# Patient Record
Sex: Female | Born: 1985 | Race: Black or African American | Hispanic: No | Marital: Single | Smoking: Never smoker
Health system: Southern US, Community
[De-identification: ages and names within clinical notes are randomized; demographics above are authoritative.]

## PROBLEM LIST (undated history)

## (undated) HISTORY — PX: NO PAST SURGERIES: SHX2092

---

## 2014-06-21 ENCOUNTER — Emergency Department (HOSPITAL_COMMUNITY)
Admission: EM | Admit: 2014-06-21 | Discharge: 2014-06-21 | Disposition: A | Discharge status: Home / Self Care | Payer: BC Managed Care – PPO | Attending: Emergency Medicine | Admitting: Emergency Medicine

## 2014-06-21 ENCOUNTER — Emergency Department (HOSPITAL_COMMUNITY): Payer: BC Managed Care – PPO

## 2014-06-21 ENCOUNTER — Encounter (HOSPITAL_COMMUNITY): Payer: Self-pay | Admitting: Emergency Medicine

## 2014-06-21 DIAGNOSIS — Z79899 Other long term (current) drug therapy: Secondary | ICD-10-CM | POA: Insufficient documentation

## 2014-06-21 DIAGNOSIS — R51 Headache: Secondary | ICD-10-CM

## 2014-06-21 DIAGNOSIS — F17200 Nicotine dependence, unspecified, uncomplicated: Secondary | ICD-10-CM | POA: Insufficient documentation

## 2014-06-21 DIAGNOSIS — H539 Unspecified visual disturbance: Secondary | ICD-10-CM

## 2014-06-21 DIAGNOSIS — R519 Headache, unspecified: Secondary | ICD-10-CM

## 2014-06-21 DIAGNOSIS — G43909 Migraine, unspecified, not intractable, without status migrainosus: Secondary | ICD-10-CM | POA: Insufficient documentation

## 2014-06-21 MED ORDER — FLUORESCEIN SODIUM 1 MG OP STRP
1.0000 | ORAL_STRIP | Freq: Once | OPHTHALMIC | Status: DC
  Filled 2014-06-21: qty 1

## 2014-06-21 MED ORDER — IBUPROFEN 800 MG PO TABS
800.0000 mg | ORAL_TABLET | Freq: Once | ORAL | Status: AC
  Administered 2014-06-21: 800 mg via ORAL
  Filled 2014-06-21: qty 1

## 2014-06-21 MED ORDER — TETRACAINE HCL 0.5 % OP SOLN
1.0000 [drp] | Freq: Once | OPHTHALMIC | Status: DC
  Filled 2014-06-21: qty 2

## 2014-06-21 NOTE — ED Notes (Signed)
Initial Contact - pt to [facility], reports c/o "blurry spot" of central visual field of R eye x5 days.  Pt reports seen by opthalmologist yesterday with completely negative exam, pt reports has specialist follow up appt tomorrow "but it's bothering me too much".  Pt reports slight amt R eye pressure, but denies all other complaints.  Neuros grossly intact.  A+Ox4.  NAD.

## 2014-06-21 NOTE — Discharge Instructions (Signed)
Visual Disturbances °You have had a disturbance in your vision. This may be caused by various conditions, such as: °· Migraines. Migraine headaches are often preceded by a disturbance in vision. Blind spots or light flashes are followed by a headache. This type of visual disturbance is temporary. It does not damage the eye. °· Glaucoma. This is caused by increased pressure in the eye. Symptoms include haziness, blurred vision, or seeing rainbow colored circles when looking at bright lights. Partial or complete visual loss can occur. You may or may not experience eye pain. Visual loss may be gradual or sudden and is irreversible. Glaucoma is the leading cause of blindness. °· Retina problems. Vision will be reduced if the retina becomes detached or if there is a circulation problem as with diabetes, high blood pressure, or a mini-stroke. Symptoms include seeing "floaters," flashes of light, or shadows, as if a curtain has fallen over your eye. °· Optic nerve problems. The main nerve in your eye can be damaged by redness, soreness, and swelling (inflammation), poor circulation, drugs, and toxins. °It is very important to have a complete exam done by a specialist to determine the exact cause of your eye problem. The specialist may recommend medicines or surgery, depending on the cause of the problem. This can help prevent further loss of vision or reduce the risk of having a stroke. Contact the caregiver to whom you have been referred and arrange for follow-up care right away. °SEEK IMMEDIATE MEDICAL CARE IF:  °· Your vision gets worse. °· You develop severe headaches. °· You have any weakness or numbness in the face, arms, or legs. °· You have any trouble speaking or walking. °Document Released: 01/23/2005 Document Revised: 03/09/2012 Document Reviewed: 05/16/2010 °ExitCare® Patient Information ©2015 [employer]. This information is not intended to replace advice given to you by your health care provider. Make sure  you discuss any questions you have with your health care provider. ° °

## 2014-06-21 NOTE — ED Provider Notes (Signed)
CSN: [id number]     Arrival date & time 06/21/14  1256 History   None    Chief Complaint  Patient presents with  . Blurred Vision  . Headache   HPI Comments: Patient presents to the ED with worsening vision in her right eye.  Patient feels that she has lost her central vision since Friday.  She states it is like having a water spot over her eye. She has tried clear eyes for it with little relief.  She states that this has never happened before.  She denies any eye pain, injury to the eye or head, foreign body sensation, floaters, halos around lights, or nausea, and vomiting.  She does have a history of genital herpes but takes daily valtrex.    Patient is a 28 y.o. female presenting with headaches. The history is provided by the patient. No language interpreter was used.  Headache Pain location:  Frontal (Right frontal with headache) Radiates to:  Does not radiate Severity currently:  2/10 Onset quality:  Gradual Duration:  4 days Timing:  Intermittent Progression:  Waxing and waning Similar to prior headaches: yes   Relieved by:  None tried Worsened by:  Nothing tried Ineffective treatments:  None tried Associated symptoms: blurred vision and visual change   Associated symptoms: no abdominal pain, no congestion, no cough, no dizziness, no ear pain, no pain, no facial pain, no fatigue, no nausea, no neck pain, no neck stiffness, no numbness, no photophobia, no sinus pressure, no sore throat, no swollen glands, no syncope, no tingling, no URI, no vomiting and no weakness   Risk factors: no anger, does not have insomnia and lifestyle not sedentary     History reviewed. No pertinent past medical history. History reviewed. No pertinent past surgical history. History reviewed. No pertinent family history. History  Substance Use Topics  . Smoking status: Current Every Day Smoker  . Smokeless tobacco: Not on file  . Alcohol Use: Yes     Comment: social   OB History   Grav Para Term  Preterm Abortions TAB SAB Ect Mult Living                 Review of Systems  Constitutional: Negative for fatigue.  HENT: Negative for congestion, ear pain, sinus pressure and sore throat.   Eyes: Positive for blurred vision and visual disturbance. Negative for photophobia, pain, discharge, redness and itching.  Respiratory: Negative for cough.   Cardiovascular: Negative for syncope.  Gastrointestinal: Negative for nausea, vomiting and abdominal pain.  Musculoskeletal: Negative for neck pain and neck stiffness.  Neurological: Positive for headaches. Negative for dizziness, facial asymmetry, light-headedness and numbness.  All other systems reviewed and are negative.     Allergies  Review of patient's allergies indicates no known allergies.  Home Medications   Prior to Admission medications   Medication Sig Start Date End Date Taking? Authorizing Provider  Multiple Vitamin (MULTIVITAMIN WITH MINERALS) TABS tablet Take 1 tablet by mouth every morning.   Yes Historical Provider, MD  norethindrone-ethinyl estradiol (JUNEL FE,GILDESS FE,LOESTRIN FE) 1-20 MG-MCG tablet Take 1 tablet by mouth every morning.   Yes Historical Provider, MD  Probiotic Product (PROBIOTIC DAILY PO) Take 1 capsule by mouth every morning.   Yes Historical Provider, MD  valACYclovir (VALTREX) 500 MG tablet Take 500 mg by mouth 2 (two) times daily.   Yes Historical Provider, MD   BP 133/80  Pulse 94  Temp(Src) 98.5 F (36.9 C) (Oral)  Resp 16  SpO2  98%  LMP 06/07/2014 Physical Exam  Nursing note and vitals reviewed. Constitutional: She is oriented to person, place, and time. She appears well-developed and well-nourished. No distress.  HENT:  Head: Normocephalic and atraumatic.  Mouth/Throat: No oropharyngeal exudate.  Eyes: Conjunctivae and EOM are normal. Pupils are equal, round, and reactive to light. Right eye exhibits no discharge. Left eye exhibits no discharge. No scleral icterus.  Fundoscopic  exam:      The right eye shows no AV nicking, no exudate, no hemorrhage and no papilledema. The right eye shows red reflex.       The left eye shows no AV nicking, no exudate, no hemorrhage and no papilledema. The left eye shows red reflex.  Right tonopen pressure of 24  Neck: Normal range of motion. Neck supple. No JVD present. No thyromegaly present.  Cardiovascular: Normal rate, regular rhythm, normal heart sounds and intact distal pulses.  Exam reveals no gallop and no friction rub.   No murmur heard. Pulmonary/Chest: Effort normal and breath sounds normal. No respiratory distress. She has no wheezes. She has no rales. She exhibits no tenderness.  Musculoskeletal: Normal range of motion.  Lymphadenopathy:    She has no cervical adenopathy.  Neurological: She is alert and oriented to person, place, and time. She has normal strength. No cranial nerve deficit or sensory deficit. Coordination normal.  Skin: Skin is warm and dry. She is not diaphoretic.  Psychiatric: She has a normal mood and affect. Her behavior is normal. Judgment and thought content normal.    ED Course  Procedures (including critical care time) Labs Review Labs Reviewed - No data to display  Imaging Review Ct Head Wo Contrast  06/21/2014   CLINICAL DATA:  Visual changes/blurred vision RIGHT eye for 4 days, headache  EXAM: CT HEAD WITHOUT CONTRAST  TECHNIQUE: Contiguous axial images were obtained from the base of the skull through the vertex without intravenous contrast.  COMPARISON:  None  FINDINGS: Normal ventricular morphology.  No midline shift or mass effect.  Normal appearance of brain parenchyma.  No intracranial hemorrhage, mass lesion, or acute infarction.  Visualized paranasal sinuses and mastoid air cells clear.  Bones unremarkable.  IMPRESSION: Normal exam.   Electronically Signed   By: Ulyses Southward M.D.   On: 06/21/2014 17:23     EKG Interpretation None      MDM   Final diagnoses:  Acute nonintractable  headache, unspecified headache type  Visual disturbance   Patient presents to the [facility] ED with complaint of headache and vision changes.  Patient was treated here with ibuprofen and she had relief of her headache.  She has been seen at [facility] on [address] by an optometrist and she was referred to a second opinion and is to be seen by what sounds like a retinal specialist tomorrow at 9:20 am.  I have rechecked tonometry here with a reading of 24 of the right eye.   Patient was given a CT scan of the head without contrast which showed no acute abnormalities at this time.  Patient is stable to be discharged at this time.  I have urged her to follow-up with her scheduled appointment tomorrow.  She does not have any meningeal signs and there are no focal neuro deficits at this time.  Patient states understanding with the above plan.  She was given strict return precautions of abrupt onset of the worst headache of her life, fever, and neck stiffness.  She states agreement.  I have  conferred with Dr. Denton LankSteinl on this case and he agrees with the above plan.      Clydie Braunourtney A Forucci, PA-C 06/21/14 1801

## 2014-06-21 NOTE — ED Notes (Signed)
Per pt, rt eye has blank spot since Friday.  [facility] and unable to diagnose any changes.  Pt was told she had some far sightedness.  Pt states she has hx of headache.  Has slight headache now.

## 2014-06-23 NOTE — ED Provider Notes (Signed)
Medical screening examination/treatment/procedure(s) were conducted as a shared visit with non-physician practitioner(s) and myself.  I personally evaluated the patient during the encounter.  Pt notes small area centrally of blurry vision in right eye for the past few days. No visual field cut or amaurosis. No acute or abrupt change or worsening today, symptoms stable since onset. States went to eye doctor but no specific dx, and has appt w retina specialist tomorrow. No eye pain. Had gradual onset dull headache earlier. No neck pain or stiffness. No numbness/weakness. Perrl. Eomi. Fundus without acute hem. Sharp discs.    Alyssa RootsKevin E Steinl, MD 06/23/14 [phone]

## 2014-07-11 ENCOUNTER — Ambulatory Visit: Payer: BC Managed Care – PPO | Admitting: Diagnostic Neuroimaging

## 2014-09-06 ENCOUNTER — Encounter: Payer: Self-pay | Admitting: Diagnostic Neuroimaging

## 2014-09-06 ENCOUNTER — Encounter (INDEPENDENT_AMBULATORY_CARE_PROVIDER_SITE_OTHER): Payer: Self-pay

## 2014-09-06 ENCOUNTER — Ambulatory Visit (INDEPENDENT_AMBULATORY_CARE_PROVIDER_SITE_OTHER): Payer: BC Managed Care – PPO | Admitting: Diagnostic Neuroimaging

## 2014-09-06 ENCOUNTER — Ambulatory Visit (INDEPENDENT_AMBULATORY_CARE_PROVIDER_SITE_OTHER): Payer: BC Managed Care – PPO

## 2014-09-06 VITALS — BP 125/73 | HR 79 | Temp 97.7°F | Ht 61.75 in | Wt 175.2 lb

## 2014-09-06 DIAGNOSIS — H538 Other visual disturbances: Secondary | ICD-10-CM

## 2014-09-06 NOTE — Procedures (Signed)
   [facility]  VEP (VISUAL EVOKED POTENTIAL) REPORT   STUDY DATE: 09/06/14 PATIENT NAME: Alyssa Gomez DOB: 1986/05/26 MRN: [id number]  ORDERING CLINICIAN: Joycelyn Schmid, MD   TECHNOLOGIST: Gearldine Shown  TECHNIQUE: The visual evoked potential test was performed using 32 x 32 check sizes with full pattern reversal. CLINICAL INFORMATION: 28 year old female with right eye vision changes.  FINDINGS: The visual acuity was 20/100 OD and 20/40 OS.  There are well formed evoked potential wave forms bilaterally.   P100 latency with right eye stimulation: 149 ms.   P100 latency with left eye stimulation: 87 ms.  The amplitudes for the P100 waveforms were also within normal limits bilaterally.   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 This can be seen in autoimmune, demyelinating or compressive etiologies.   INTERPRETING PHYSICIAN:  Suanne Marker, MD Certified in Neurology, Neurophysiology and Neuroimaging  [facility] [address], [address] [place], [place] [address] [phone]

## 2014-09-06 NOTE — Progress Notes (Signed)
[facility]  PATIENT: Alyssa Gomez DOB: [birth year]  REFERRING CLINICIAN: Allyne Gee  HISTORY FROM: patient and friend  REASON FOR VISIT: new consult    HISTORICAL  CHIEF COMPLAINT:  Chief Complaint  Patient presents with  . Loss of Vision    HISTORY OF PRESENT ILLNESS:   28 year old female with here for evaluation of blurred vision in the right eye.  June 2015 patient developed sudden onset blurred vision in the right eye. Patient went to a doctor then referred to retina specialist. No specific ocular abnormality was found. Patient had MRI of the brain and orbits which was notable for a few nonspecific foci of gliosis but otherwise normal orbits. Patient then referred to me for further evaluation.  She also has history of headaches is 28 years old, frontal, temporal, pressure throbbing headaches without nausea or vomiting. She has some photophobia. Patient was having 2 headaches per week but now reduced to one per month. Patient did have some additional headache during this episode of right eye blurred vision in June.  Patient continues to have some revision of the right eye. She feels that her symptoms are slightly improved.  No numbness, weakness, balance difficulty, incontinence, slurred speech or trouble talking.   REVIEW OF SYSTEMS: Full 14 system review of systems performed and notable only for blurred vision feeling hot headache snoring.    ALLERGIES: No Known Allergies  HOME MEDICATIONS: Outpatient Prescriptions Prior to Visit  Medication Sig Dispense Refill  . norethindrone-ethinyl estradiol (JUNEL FE,GILDESS FE,LOESTRIN FE) 1-20 MG-MCG tablet Take 1 tablet by mouth every morning.      . valACYclovir (VALTREX) 500 MG tablet Take 500 mg by mouth daily.       . Lactobacillus (ACIDOPHOLUS PO) Take 1 tablet by mouth daily.      . Multiple Vitamin (MULTIVITAMIN WITH MINERALS) TABS tablet Take 1 tablet by mouth every morning.       No  facility-administered medications prior to visit.    PAST MEDICAL HISTORY: History reviewed. No pertinent past medical history.  PAST SURGICAL HISTORY: Past Surgical History  Procedure Laterality Date  . No past surgeries      FAMILY HISTORY: Family History  Problem Relation Age of Onset  . Diabetes Mother   . Hypertension Father   . Pancreatic cancer Paternal Grandmother   . Blindness Cousin     SOCIAL HISTORY:  History   Social History  . Marital Status: Single    Spouse Name: N/A    Number of Children: 0  . Years of Education: BA   Occupational History  .  Other    [employer]   Social History Main Topics  . Smoking status: Never Smoker   . Smokeless tobacco: Never Used  . Alcohol Use: Not on file     Comment: social, one drink per week  . Drug Use: Yes     Comment: marijuna daily  . Sexual Activity: Not on file   Other Topics Concern  . Not on file   Social History Narrative   Patient lives at home alone.   Caffeine Use: Rarely     PHYSICAL EXAM  Filed Vitals:   09/06/14 1042  BP: 125/73  Pulse: 79  Temp: 97.7 F (36.5 C)  TempSrc: Oral  Height: 5' 1.75" (1.568 m)  Weight: 175 lb 3.2 oz (79.47 kg)    Not recorded    Body mass index is 32.32 kg/(m^2).  GENERAL EXAM: Patient is in no distress; well  developed, nourished and groomed; neck is supple; MOON FACIES, ENLARGED RIGHT THYROID, PROPTOSIS  CARDIOVASCULAR: Regular rate and rhythm, no murmurs, no carotid bruits  NEUROLOGIC: MENTAL STATUS: awake, alert, oriented to person, place and time, recent and remote memory intact, normal attention and concentration, language fluent, comprehension intact, naming intact, fund of knowledge appropriate CRANIAL NERVE: no papilledema on fundoscopic exam, pupils equal and reactive to light; RELATIVE RIGHT AFFERENT PUPILLARY DEFECT WITH DECR SENS TO LIGHT IN RIGHT EYE; RIGHT EYE 20/100, LEFT EYE 20/40, , visual fields full to confrontation, extraocular  muscles intact, no nystagmus, facial sensation and strength symmetric, hearing intact, palate elevates symmetrically, uvula midline, shoulder shrug symmetric, tongue midline. MOTOR: normal bulk and tone, full strength in the BUE, BLE SENSORY: normal and symmetric to light touch, pinprick, temperature, vibration  COORDINATION: finger-nose-finger, fine finger movements, heel-shin normal REFLEXES: deep tendon reflexes present and symmetric GAIT/STATION: narrow based gait; able to walk on toes, heels and tandem; romberg is negative    DIAGNOSTIC DATA (LABS, IMAGING, TESTING) - I reviewed patient records, labs, notes, testing and imaging myself where available.  No results found for this basename: WBC, HGB, HCT, MCV, PLT   No results found for this basename: na, k, cl, co2, glucose, bun, creatinine, calcium, prot, albumin, ast, alt, alkphos, bilitot, gfrnonaa, gfraa   No results found for this basename: CHOL, HDL, LDLCALC, LDLDIRECT, TRIG, CHOLHDL   No results found for this basename: HGBA1C   No results found for this basename: VITAMINB12   No results found for this basename: TSH    I reviewed images myself and agree with interpretation. -VRP  06/24/14 MRI brain and orbits (without; patient refuse IV contrast) - few round and ovoid, periventricular and subcortical T2 hyperintensities   ASSESSMENT AND PLAN  28 y.o. year old female here with right eye vision change, suspicious for optic neuritis. MRI shows a few small T2 hyperintensities, could be autoimmune, inflammatory, post-infectious or microvascular ischemic. No IV contrast given (patient refused).  Ddx: idiopathic optic neuritis, multiple sclerosis, neuromyelitis optica, post-infectious, autoimmune/inflamm, vasculitis  PLAN: - Lab testing - VEP - repeat MRI brain and orbits (with and without contrast this time) - consider lumbar puncture   Orders Placed This Encounter  Procedures  . ANA w/Reflex  . Pan-ANCA  . Lyme,  Total Ab Test/Reflex  . RPR, Rfx Qn RPR/Confirm TP  . Vitamin B12  . Vitamin B1  . Folate RBC  . Angiotensin converting enzyme  . TSH  . T4, Free  . HIV antibody (with reflex)  . Hemoglobin A1c  . Visual evoked potential test   Return in about 6 weeks (around 10/18/2014).    Suanne Marker, MD 09/06/2014, 11:59 AM Certified in Neurology, Neurophysiology and Neuroimaging  [facility] [address], [address] [place], [place] [address] [phone]

## 2014-09-06 NOTE — Patient Instructions (Signed)
I will check additional testing. 

## 2014-09-08 LAB — HIV ANTIBODY (ROUTINE TESTING W REFLEX)
HIV 1/HIV 2 AB: NONREACTIVE
HIV 1/O/2 Abs-Index Value: <1 (ref <1.00)

## 2014-09-08 LAB — PAN-ANCA
ANCA Proteinase 3: <3.5 U/mL (ref 0.0–3.5)
C-ANCA: <1:20 {titer}
Myeloperoxidase Ab: <9 U/mL (ref 0.0–9.0)

## 2014-09-08 LAB — ANA W/REFLEX: Anti Nuclear Antibody(ANA): NEGATIVE

## 2014-09-08 LAB — HEMOGLOBIN A1C
Est. average glucose Bld gHb Est-mCnc: 111 mg/dL
Hgb A1c MFr Bld: 5.5 % (ref 4.8–5.6)

## 2014-09-08 LAB — FOLATE RBC
Folate, Hemolysate: 401.1 ng/mL
Folate, RBC: 901 ng/mL (ref >498)
HCT: 44.5 % (ref 34.0–46.6)

## 2014-09-08 LAB — ANGIOTENSIN CONVERTING ENZYME: Angio Convert Enzyme: 34 U/L (ref 14–82)

## 2014-09-08 LAB — TSH: TSH: 1.57 u[IU]/mL (ref 0.450–4.500)

## 2014-09-08 LAB — VITAMIN B12: Vitamin B-12: 424 pg/mL (ref 211–946)

## 2014-09-08 LAB — RPR QUALITATIVE: RPR: NONREACTIVE

## 2014-09-08 LAB — LYME, TOTAL AB TEST/REFLEX

## 2014-09-08 LAB — VITAMIN B1, WHOLE BLOOD

## 2014-09-08 LAB — T4, FREE: Free T4: 1.2 ng/dL (ref 0.82–1.77)

## 2014-09-09 LAB — VITAMIN B1, WHOLE BLOOD: THIAMINE: 150.7 nmol/L (ref 66.5–200.0)

## 2014-09-12 ENCOUNTER — Telehealth: Payer: Self-pay | Admitting: Diagnostic Neuroimaging

## 2014-09-12 NOTE — Telephone Encounter (Signed)
Patient calling to request bloodwork results, please call and advise.

## 2014-09-15 NOTE — Telephone Encounter (Signed)
Patient calling again for bloodwork results, states she hasn't heard anything back yet, please call and advise.

## 2014-09-15 NOTE — Telephone Encounter (Signed)
Labs are normal . -VRP

## 2014-09-16 NOTE — Telephone Encounter (Signed)
Spoke to patient, gave results.  

## 2014-09-26 ENCOUNTER — Ambulatory Visit: Payer: BC Managed Care – PPO | Admitting: Diagnostic Neuroimaging

## 2014-10-18 ENCOUNTER — Ambulatory Visit: Payer: BC Managed Care – PPO | Admitting: Diagnostic Neuroimaging

## 2015-06-24 IMAGING — CT CT HEAD W/O CM
1 series · 16 of 30 positions shown, 20 images · non-contrast
Comparison: None

CLINICAL DATA: Visual changes/blurred vision RIGHT eye for 4 days,
headache

EXAM:
CT HEAD WITHOUT CONTRAST
TECHNIQUE: Contiguous axial images were obtained from the base of the skull
through the vertex without intravenous contrast.

[Series 2: headseq 4.8 h45s · axial · 0.39mm/px · z∈[-241,-86]mm · 16 of 36 slices shown, 20 images]
[im 2/36  brain]
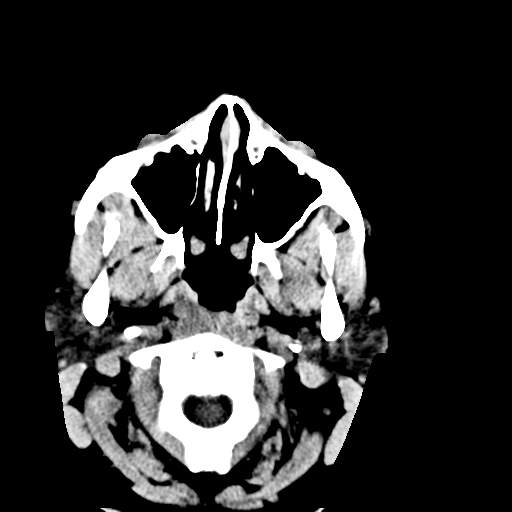
[im 2/36  bone]
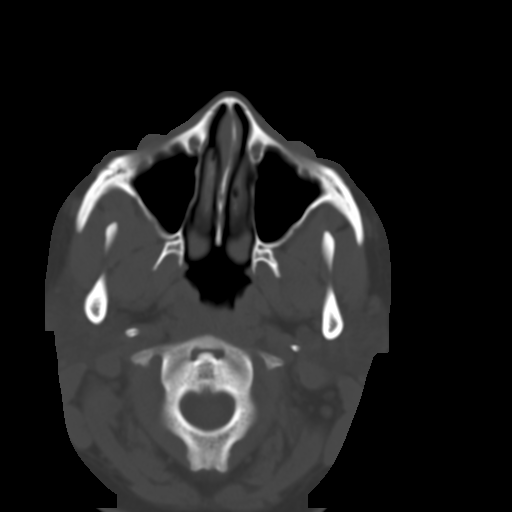
[im 4/36  brain]
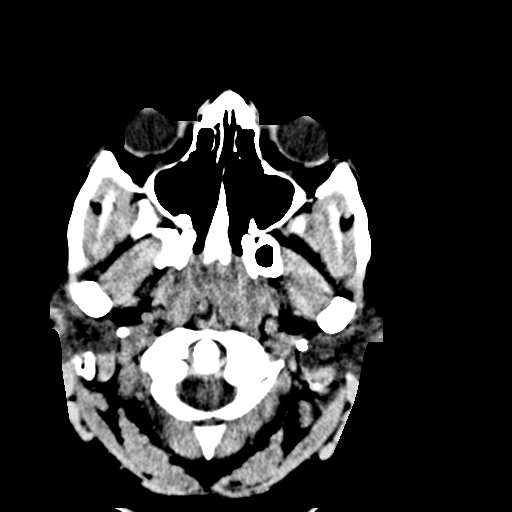
[im 7/36  brain]
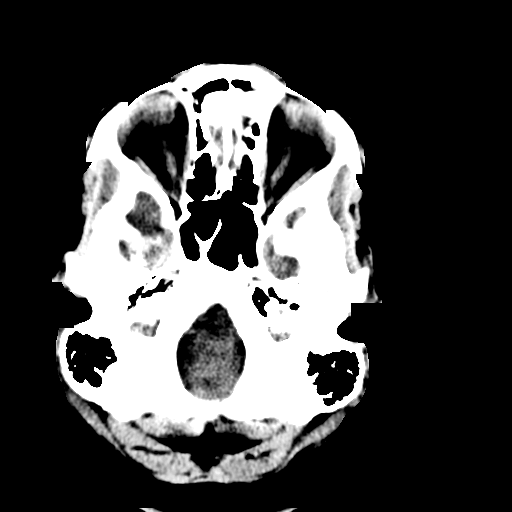
[im 9/36  brain]
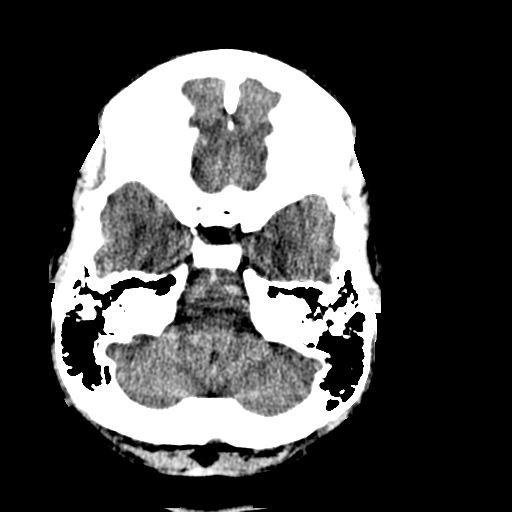
[im 10/36  brain]
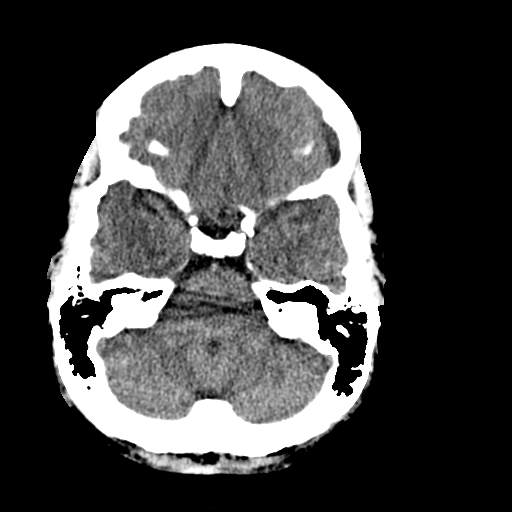
[im 10/36  bone]
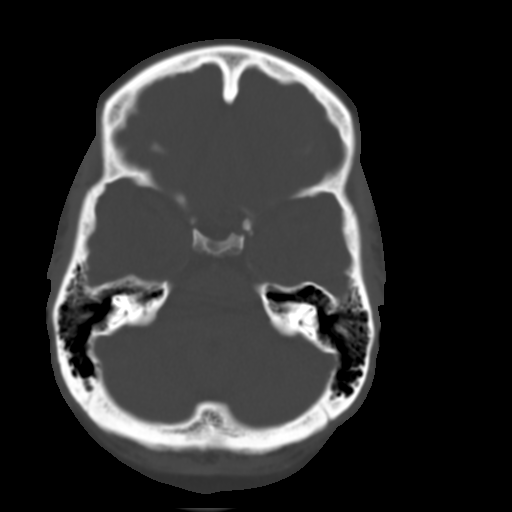
[im 13/36  brain]
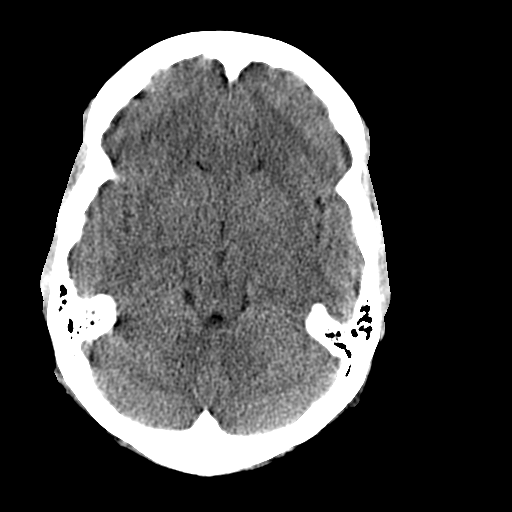
[im 15/36  brain]
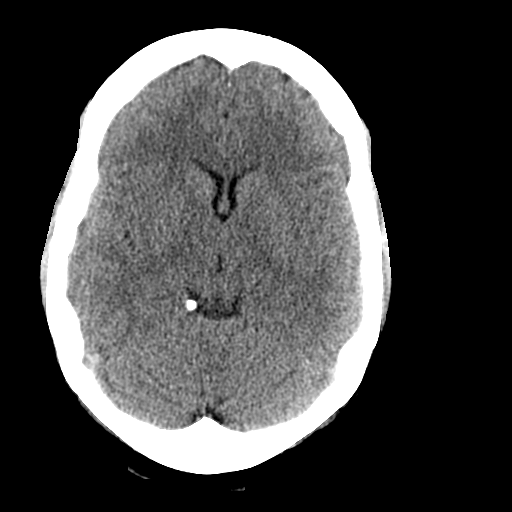
[im 17/36  brain]
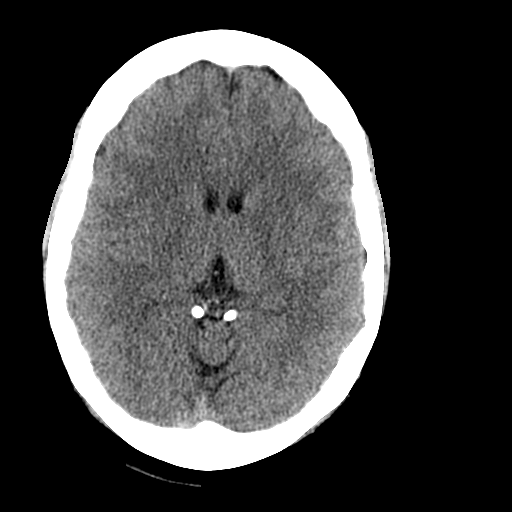
[im 19/36  brain]
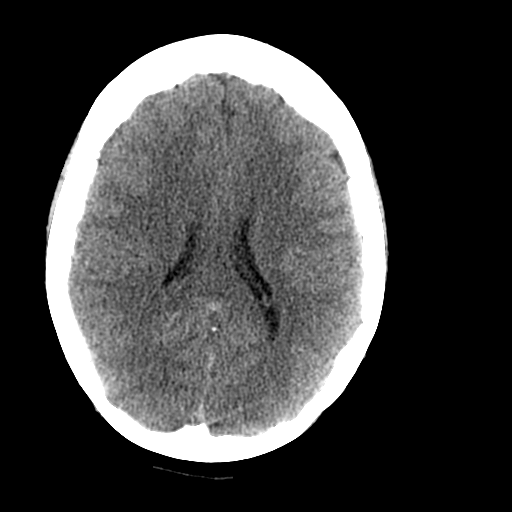
[im 19/36  bone]
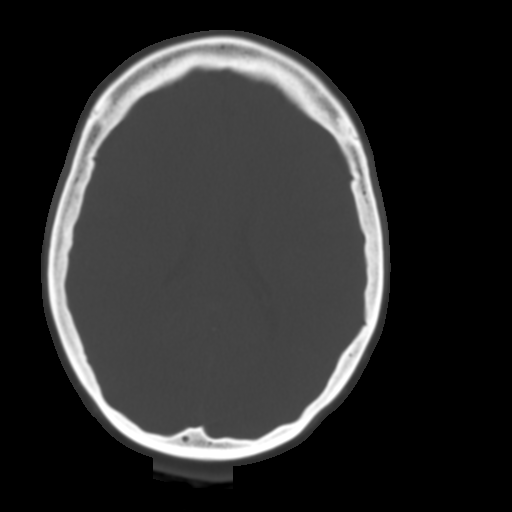
[im 21/36  brain]
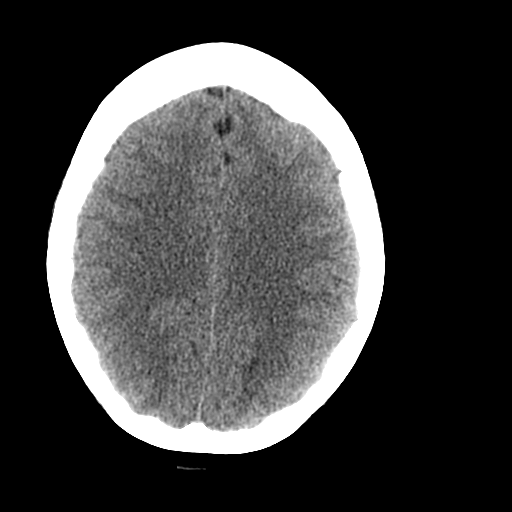
[im 23/36  brain]
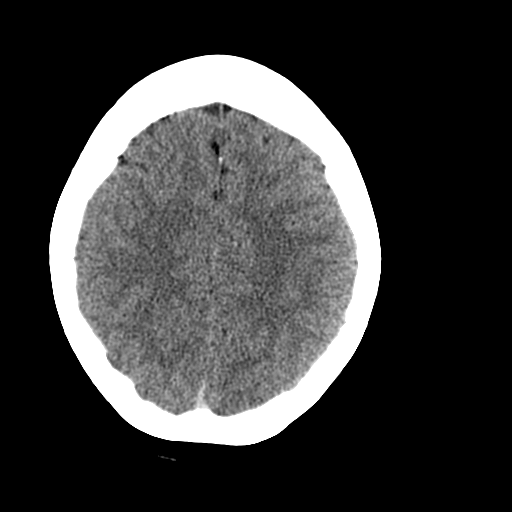
[im 26/36  brain]
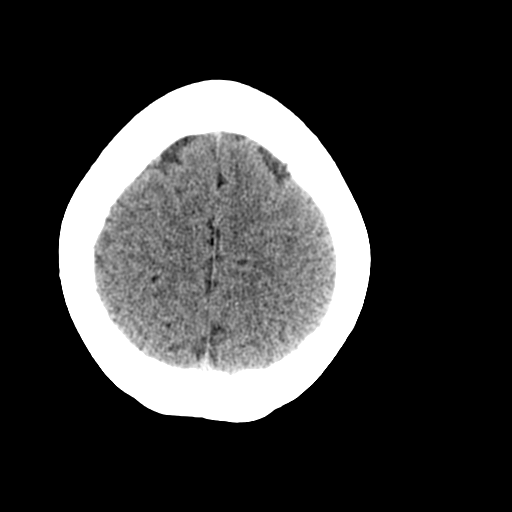
[im 27/36  brain]
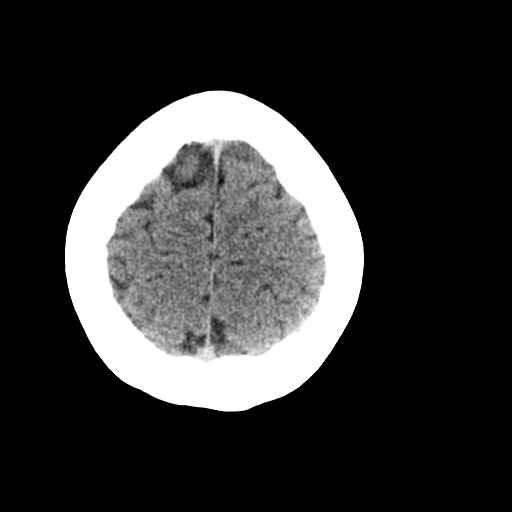
[im 27/36  bone]
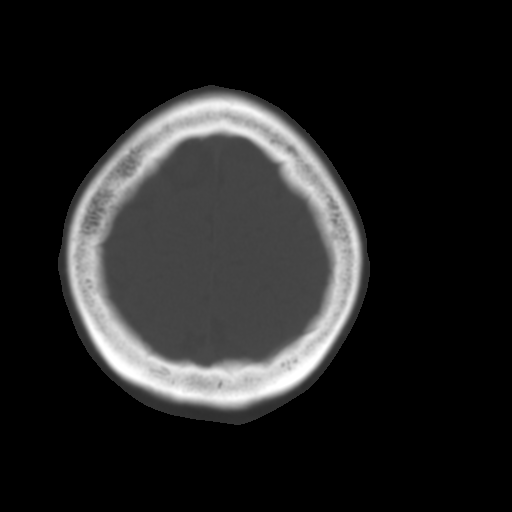
[im 29/36  brain]
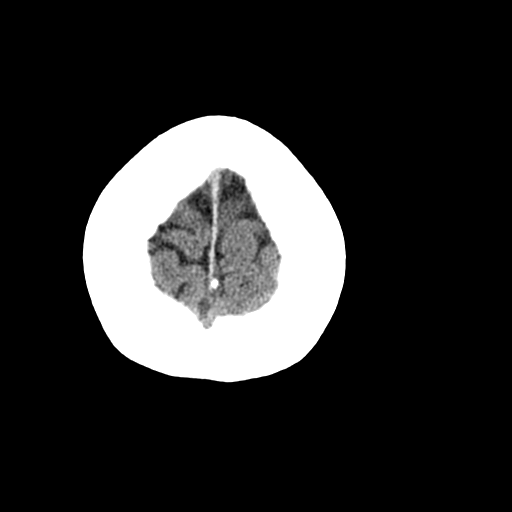
[im 32/36  brain]
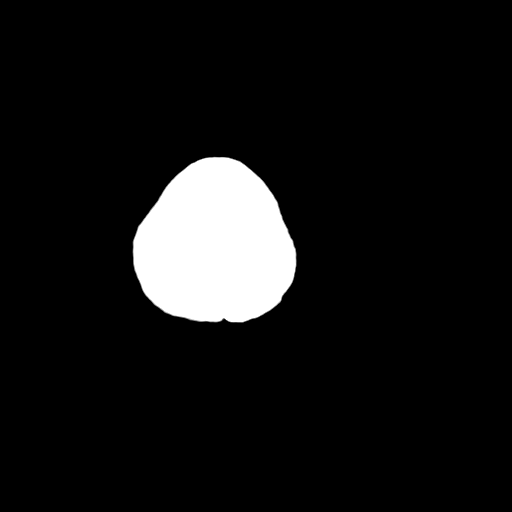
[im 34/36  brain]
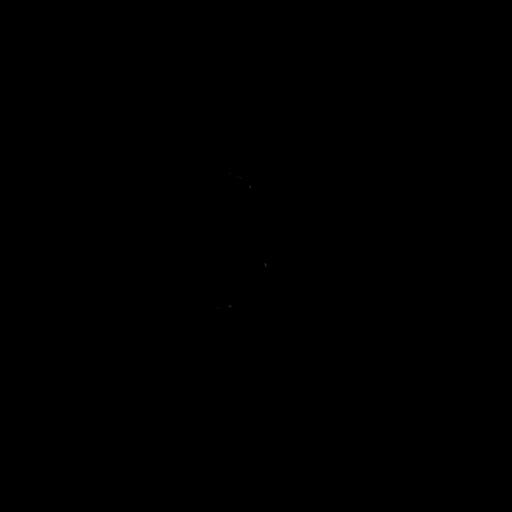

[16 of 30 positions shown; findings below may reference images not displayed]

FINDINGS: Normal ventricular morphology.

No midline shift or mass effect.

Normal appearance of brain parenchyma.

No intracranial hemorrhage, mass lesion, or acute infarction.

Visualized paranasal sinuses and mastoid air cells clear.

Bones unremarkable.
IMPRESSION: Normal exam.
